# Patient Record
Sex: Male | Born: 1940 | Race: White | Hispanic: No | Marital: Married | Smoking: Former smoker
Health system: Southern US, Community
[De-identification: ages and names within clinical notes are randomized; demographics above are authoritative.]

## PROBLEM LIST (undated history)

## (undated) DIAGNOSIS — I2510 Atherosclerotic heart disease of native coronary artery without angina pectoris: Secondary | ICD-10-CM

## (undated) DIAGNOSIS — N200 Calculus of kidney: Secondary | ICD-10-CM

## (undated) DIAGNOSIS — F419 Anxiety disorder, unspecified: Secondary | ICD-10-CM

## (undated) DIAGNOSIS — E119 Type 2 diabetes mellitus without complications: Secondary | ICD-10-CM

## (undated) DIAGNOSIS — K219 Gastro-esophageal reflux disease without esophagitis: Secondary | ICD-10-CM

## (undated) DIAGNOSIS — R32 Unspecified urinary incontinence: Secondary | ICD-10-CM

## (undated) DIAGNOSIS — I10 Essential (primary) hypertension: Secondary | ICD-10-CM

## (undated) DIAGNOSIS — J310 Chronic rhinitis: Secondary | ICD-10-CM

## (undated) DIAGNOSIS — E785 Hyperlipidemia, unspecified: Secondary | ICD-10-CM

## (undated) DIAGNOSIS — C61 Malignant neoplasm of prostate: Secondary | ICD-10-CM

## (undated) DIAGNOSIS — J45909 Unspecified asthma, uncomplicated: Secondary | ICD-10-CM

## (undated) HISTORY — DX: Unspecified asthma, uncomplicated: J45.909

## (undated) HISTORY — DX: Hyperlipidemia, unspecified: E78.5

## (undated) HISTORY — DX: Malignant neoplasm of prostate: C61

## (undated) HISTORY — DX: Essential (primary) hypertension: I10

## (undated) HISTORY — DX: Calculus of kidney: N20.0

## (undated) HISTORY — DX: Unspecified urinary incontinence: R32

## (undated) HISTORY — PX: CARDIAC SURGERY: SHX584

## (undated) HISTORY — DX: Anxiety disorder, unspecified: F41.9

## (undated) HISTORY — DX: Gastro-esophageal reflux disease without esophagitis: K21.9

## (undated) HISTORY — DX: Type 2 diabetes mellitus without complications: E11.9

## (undated) HISTORY — DX: Chronic rhinitis: J31.0

## (undated) HISTORY — DX: Atherosclerotic heart disease of native coronary artery without angina pectoris: I25.10

## (undated) HISTORY — PX: CHOLECYSTECTOMY: SHX55

---

## 2015-11-22 ENCOUNTER — Encounter: Payer: Self-pay | Admitting: Internal Medicine

## 2015-11-22 ENCOUNTER — Ambulatory Visit (INDEPENDENT_AMBULATORY_CARE_PROVIDER_SITE_OTHER): Payer: Medicare Other | Admitting: Internal Medicine

## 2015-11-22 VITALS — BP 138/78 | HR 67 | Ht 73.0 in | Wt 196.6 lb

## 2015-11-22 DIAGNOSIS — J45991 Cough variant asthma: Secondary | ICD-10-CM | POA: Diagnosis not present

## 2015-11-22 DIAGNOSIS — R058 Other specified cough: Secondary | ICD-10-CM

## 2015-11-22 DIAGNOSIS — R05 Cough: Secondary | ICD-10-CM | POA: Diagnosis not present

## 2015-11-22 LAB — NITRIC OXIDE: Nitric Oxide: 24

## 2015-11-22 MED ORDER — BUDESONIDE-FORMOTEROL FUMARATE 80-4.5 MCG/ACT IN AERO: 2.0000 | INHALATION_SPRAY | Freq: Two times a day (BID) | RESPIRATORY_TRACT | 0 refills | Status: DC

## 2015-11-22 MED ORDER — BUDESONIDE-FORMOTEROL FUMARATE 80-4.5 MCG/ACT IN AERO: INHALATION_SPRAY | RESPIRATORY_TRACT | 12 refills | Status: AC

## 2015-11-22 NOTE — Patient Instructions (Addendum)
Stop advair and start symbicort 80 Take 2 puffs first thing in am and then another 2 puffs about 12 hours later - if better continue symbicort,  If not ok to resume advair if prefer it or insurance won't pay for symbicort   Work on inhaler technique:  relax and gently blow all the way out then take a nice smooth deep breath back in, triggering the inhaler at same time you start breathing in.  Hold for up to 5 seconds if you can. Blow out thru nose. Rinse and gargle with water when done     Change nexium to 40 mg Take 30-60 min before first meal of the day and pepcid ac 20mg  at bedtime   GERD (REFLUX)  is an extremely common cause of respiratory symptoms just like yours , many times with no obvious heartburn at all.    It can be treated with medication, but also with lifestyle changes including elevation of the head of your bed (ideally with 6 inch  bed blocks),  Smoking cessation, avoidance of late meals, excessive alcohol, and avoid fatty foods, chocolate, peppermint, colas, red wine, and acidic juices such as orange juice.  NO MINT OR MENTHOL PRODUCTS SO NO COUGH DROPS   USE SUGARLESS CANDY INSTEAD (Jolley ranchers or Stover's or Life Savers) or even ice chips will also do - the key is to swallow to prevent all throat clearing. NO OIL BASED VITAMINS - use powdered substitutes.  Please schedule a follow up office visit in 4 weeks, sooner if needed

## 2015-11-22 NOTE — Progress Notes (Signed)
Subjective:    Patient ID: Jeremy Bates, male    DOB: 1941/01/04,    MRN: [id number]  HPI  89 yowm quit smoking in 20's with freq flares of bronchitis then worse in 30's and  requiring daily advair since around  2007 referred to pulmonary clinic 11/22/2015 by Dr Valora Piccolo re poorly controlled rhinitis x lifelong and worse in spring and fall with green mucus with either treatment with abx plus /minus and twice prednisone eliminated it completely   11/22/2015 1st [facility] office visit/ Pansie Guggisberg  On advair/ atrovent NS/astelin/singulair/ nexium daily Chief Complaint  Patient presents with  . pulmonary consult    per Dr. Valora Piccolo for hx of copd & asthma. dx around 2007. had an asthma attack 2-29mo ago improved with prednisone.   each am wakes up nasal drainage and mucus clear x half hour to resolved daily year round - in terms of sob attacks assoc with chest tightness and prev   attributed to asthma he Rarely uses alb but during the flares up to  3 x per day  - Prednisone always eliminates the breathing problems but not the rhinitis symptoms that he thinks are linked  to the cough /wheeze as they rhinitis typically flares first.  Allergy eval neg 5 y prior to OV "neg" per pt     No obvious day to day or daytime variability or assoc  purulent sputum or mucus plugs or hemoptysis or cp or chest tightness, subjective wheeze or overt hb symptoms. No unusual exp hx or h/o childhood pna/ asthma or knowledge of premature birth.  Sleeping ok without nocturnal  or early am exacerbation  of respiratory  c/o's or need for noct saba. Also denies any obvious fluctuation of symptoms with weather or environmental changes or other aggravating or alleviating factors except as outlined above   Current Medications, Allergies, Complete Past Medical History, Past Surgical History, Family History, and Social History were reviewed in [employer] record.  ROS  The following are not active complaints  unless bolded sore throat, dysphagia, dental problems, itching, sneezing,  nasal congestion or excess drainage/ purulent secretions, ear ache,   fever, chills, sweats, unintended wt loss, classically pleuritic or exertional cp,  orthopnea pnd or leg swelling, presyncope, palpitations, abdominal pain, anorexia, nausea, vomiting, diarrhea  or change in bowel or bladder habits, change in stools or urine, dysuria,hematuria,  rash, arthralgias, visual complaints, headache, numbness, weakness or ataxia or problems with walking or coordination,  change in mood/affect or memory.           Review of Systems  Eyes: Positive for redness.  Hematological: Bruises/bleeds easily.       Objective:   Physical Exam   amb wm nad  Wt Readings from Last 3 Encounters:  11/22/15 196 lb 9.6 oz (89.2 kg)    Vital signs reviewed    HEENT: nl dentition, turbinates, and oropharynx. Nl external ear canals without cough reflex   NECK :  without JVD/Nodes/TM/ nl carotid upstrokes bilaterally   LUNGS: no acc muscle use,  Nl contour chest which is clear to A and P bilaterally without cough on insp or exp maneuvers   CV:  RRR  no s3 or murmur or increase in P2, no edema   ABD:  soft and nontender with nl inspiratory excursion in the supine position. No bruits or organomegaly, bowel sounds nl  MS:  Nl gait/ ext warm without deformities, calf tenderness, cyanosis or clubbing No obvious joint restrictions  SKIN: warm and dry without lesions    NEURO:  alert, approp, nl sensorium with  no motor deficits      I personally reviewed images and agree with radiology impression as follows:  CXR:  10/21/15  There is no acute cardiopulmonary abnormality. Stable top-normal cardiac size without pulmonary edema. Post CABG changes.      Assessment & Plan:

## 2015-11-23 NOTE — Assessment & Plan Note (Addendum)
Classic Upper airway cough syndrome, so named because it's frequently impossible to sort out how much is  CR/sinusitis with freq throat clearing (which can be related to primary GERD)   vs  causing  secondary (" extra esophageal")  GERD from wide swings in gastric pressure that occur with throat clearing, often  promoting self use of mint and menthol lozenges that reduce the lower esophageal sphincter tone and exacerbate the problem further in a cyclical fashion.   These are the same pts (now being labeled as having "irritable larynx syndrome" by some cough centers) who not infrequently have a history of having failed to tolerate ace inhibitors,  dry powder inhalers or biphosphonates or report having atypical reflux symptoms that don't respond to standard doses of PPI , and are easily confused as having aecopd or asthma flares by even experienced allergists/ pulmonologists myself included.  For now no change in rx but should be able to consolidate/ symplify rx as a sign of ineffective therapy = taking too medications for the same problem and he is on every category I know of to treat rhinitis.

## 2015-11-23 NOTE — Assessment & Plan Note (Addendum)
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 DDX of  difficult airways management almost all start with A and  include Adherence, Ace Inhibitors, Acid Reflux, Active Sinus Disease, Alpha 1 Antitripsin deficiency, Anxiety masquerading as Airways dz,  ABPA,  Allergy(esp in young), Aspiration (esp in elderly), Adverse effects of meds,  Active smokers, A bunch of PE's (a small clot burden can't cause this syndrome unless there is already severe underlying pulm or vascular dz with poor reserve) plus two Bs  = Bronchiectasis and Beta blocker use..and one C= CHF  Adherence is always the initial "prime suspect" and is a multilayered concern that requires a "trust but verify" approach in every patient - starting with knowing how to use medications, especially inhalers, correctly, keeping up with refills and understanding the fundamental difference between maintenance and prns vs those medications only taken for a very short course and then stopped and not refilled.  - The proper method of use, as well as anticipated side effects, of a metered-dose inhaler are discussed and demonstrated to the patient. Improved effectiveness after extensive coaching during this visit to a level of approximately 75 % from a baseline of 50 % > try symbicort 80 2bid  ? Acid (or non-acid) GERD > always difficult to exclude as up to 75% of pts in some series report no assoc GI/ Heartburn symptoms> rec max (24h)  acid suppression and diet restrictions/ reviewed and instructions given in writing.   ? Adverse effects of advair, which can aggravate uacs (see a/p)  ? Allergies/ low NO rules against and he states w/u neg prev but will continue singulair for now  Active  sinus dz /rhinitis >  see uacs   For now will focus on these issues and return in 6 weeks to regroup re management of rhinitis   Total time devoted to counseling  = 35/21m review case with pt/ discussion of options/alternatives/ personally creating written instructions  in presence of pt  then going over those specific  Instructions directly with the pt including how to use all of the meds but in particular covering each new medication in detail and the difference between the maintenance/automatic meds and the prns using an action plan format for the latter.

## 2015-12-20 ENCOUNTER — Other Ambulatory Visit (INDEPENDENT_AMBULATORY_CARE_PROVIDER_SITE_OTHER): Payer: Medicare Other

## 2015-12-20 ENCOUNTER — Ambulatory Visit (INDEPENDENT_AMBULATORY_CARE_PROVIDER_SITE_OTHER): Payer: Medicare Other | Admitting: Internal Medicine

## 2015-12-20 ENCOUNTER — Ambulatory Visit (INDEPENDENT_AMBULATORY_CARE_PROVIDER_SITE_OTHER)
Admission: RE | Admit: 2015-12-20 | Discharge: 2015-12-20 | Disposition: A | Discharge status: Home / Self Care | Payer: Medicare Other | Source: Ambulatory Visit | Attending: Internal Medicine | Admitting: Internal Medicine

## 2015-12-20 ENCOUNTER — Encounter: Payer: Self-pay | Admitting: Internal Medicine

## 2015-12-20 VITALS — BP 134/70 | HR 53 | Ht 72.0 in | Wt 197.0 lb

## 2015-12-20 DIAGNOSIS — R058 Other specified cough: Secondary | ICD-10-CM

## 2015-12-20 DIAGNOSIS — J45991 Cough variant asthma: Secondary | ICD-10-CM | POA: Diagnosis not present

## 2015-12-20 DIAGNOSIS — R05 Cough: Secondary | ICD-10-CM

## 2015-12-20 LAB — CBC WITH DIFFERENTIAL/PLATELET
BASOS ABS: 0.1 10*3/uL (ref 0.0–0.1)
Basophils Relative: 1.2 % (ref 0.0–3.0)
EOS ABS: 0.3 10*3/uL (ref 0.0–0.7)
EOS PCT: 5.1 % — AB (ref 0.0–5.0)
HCT: 38.7 % — ABNORMAL LOW (ref 39.0–52.0)
HEMOGLOBIN: 13.3 g/dL (ref 13.0–17.0)
LYMPHS ABS: 1.1 10*3/uL (ref 0.7–4.0)
Lymphocytes Relative: 18.9 % (ref 12.0–46.0)
MCHC: 34.4 g/dL (ref 30.0–36.0)
MCV: 85.9 fl (ref 78.0–100.0)
MONO ABS: 0.5 10*3/uL (ref 0.1–1.0)
Monocytes Relative: 8.8 % (ref 3.0–12.0)
NEUTROS PCT: 66 % (ref 43.0–77.0)
Neutro Abs: 3.8 10*3/uL (ref 1.4–7.7)
Platelets: 204 10*3/uL (ref 150.0–400.0)
RBC: 4.5 Mil/uL (ref 4.22–5.81)
RDW: 14.7 % (ref 11.5–15.5)
WBC: 5.8 10*3/uL (ref 4.0–10.5)

## 2015-12-20 NOTE — Progress Notes (Signed)
Subjective:    Patient ID: Jeremy Bates, male    DOB: 1940/08/16,    MRN: [id number]    Brief patient profile:  68 yowm quit smoking in 20's with freq flares of bronchitis then worse in 30's and  requiring daily advair since around  2007 referred to pulmonary clinic 11/22/2015 by Dr Valora Piccolo re poorly controlled rhinitis x lifelong and worse in spring and fall with green mucus with either treatment with abx plus /minus and twice prednisone eliminated it completely   History of Present Illness  11/22/2015 1st [facility] office visit/ Jeremy Bates  On advair/ atrovent NS/astelin/singulair/ nexium daily Chief Complaint  Patient presents with  . pulmonary consult    per Dr. Valora Piccolo for hx of copd & asthma. dx around 2007. had an asthma attack 2-71mo ago improved with prednisone.   each am wakes up nasal drainage and mucus clear x half hour to resolved daily year round - in terms of sob attacks assoc with chest tightness and prev   attributed to asthma he Rarely uses alb but during the flares up to  3 x per day  - Prednisone always eliminates the breathing problems but not the rhinitis symptoms that he thinks are linked  to the cough /wheeze as they rhinitis typically flares first. Allergy eval neg 5 y prior to OV "neg" per pt   rec Stop advair and start symbicort 80 Take 2 puffs first thing in am and then another 2 puffs about 12 hours later - if better continue symbicort,  If not ok to resume advair if prefer it or insurance won't pay for symbicort  Work on inhaler technique:   Change nexium to 40 mg Take 30-60 min before first meal of the day and pepcid ac 20mg  at bedtime  GERD (REFLUX) Please schedule a follow up office visit in 4 weeks, sooner if needed     12/20/2015  f/u ov/Jeremy Bates re: cough variant asthma vs uacs on symbicort 80/singulair Nexium/ pepcid  Chief Complaint  Patient presents with  . Follow-up    Pt c/o sinus congestion and acid reflux symptoms.   breathing is better to his  satisfaction and no need for albtuerol Morning's are tough p waking up takes about hour to clear out "drainage in back of throat" > fill up a small cup x one hours> clear mucus x years flares in spring and fall despite neg allergy w/u previously.   No obvious other day to day or daytime variability or assoc  purulent sputum or mucus plugs or hemoptysis or cp or chest tightness, subjective wheeze or overt hb symptoms. No unusual exp hx or h/o childhood pna/ asthma or knowledge of premature birth.  Sleeping ok without nocturnal  or early am exacerbation  of respiratory  c/o's or need for noct saba. Also denies any obvious fluctuation of symptoms with weather or environmental changes or other aggravating or alleviating factors except as outlined above   Current Medications, Allergies, Complete Past Medical History, Past Surgical History, Family History, and Social History were reviewed in [employer] record.  ROS  The following are not active complaints unless bolded sore throat, dysphagia, dental problems, itching, sneezing,  nasal congestion or excess drainage/ purulent secretions, ear ache,   fever, chills, sweats, unintended wt loss, classically pleuritic or exertional cp,  orthopnea pnd or leg swelling, presyncope, palpitations, abdominal pain, anorexia, nausea, vomiting, diarrhea  or change in bowel or bladder habits, change in stools or urine, dysuria,hematuria,  rash, arthralgias, visual complaints, headache, numbness, weakness or ataxia or problems with walking or coordination,  change in mood/affect or memory.                 Objective:   Physical Exam   amb wm nad  Wt Readings from Last 3 Encounters:  11/22/15 196 lb 9.6 oz (89.2 kg)    Vital signs reviewed    HEENT: nl dentition, turbinates, and oropharynx. Nl external ear canals without cough reflex   NECK :  without JVD/Nodes/TM/ nl carotid upstrokes bilaterally   LUNGS: no acc muscle use,  Nl  contour chest which is clear to A and P bilaterally without cough on insp or exp maneuvers   CV:  RRR  no s3 or murmur or increase in P2, no edema   ABD:  soft and nontender with nl inspiratory excursion in the supine position. No bruits or organomegaly, bowel sounds nl  MS:  Nl gait/ ext warm without deformities, calf tenderness, cyanosis or clubbing No obvious joint restrictions   SKIN: warm and dry without lesions    NEURO:  alert, approp, nl sensorium with  no motor deficits      I personally reviewed images and agree with radiology impression as follows:  CXR:  10/21/15  There is no acute cardiopulmonary abnormality. Stable top-normal cardiac size without pulmonary edema. Post CABG changes.   Labs ordered 12/20/2015 cbc with diff/ eos     Assessment & Plan:

## 2015-12-20 NOTE — Patient Instructions (Signed)
Stop singulair and add For drainage / throat tickle try take CHLORPHENIRAMINE  4 mg - take one every 4 hours as needed - available over the counter- may cause drowsiness so start with just a bedtime dose or two and see how you tolerate it before trying in daytime     Please see patient coordinator before you leave today  to schedule sinus ct  Please remember to go to the lab   department downstairs for your tests - we will call you with the results when they are available.     See Tammy NP 4 weeks with all your medications, even over the counter meds, separated in two separate bags, the ones you take no matter what vs the ones you stop once you feel better and take only as needed when you feel you need them.   Tammy  will generate for you a new user friendly medication calendar that will put [place] all on the same page re: your medication use.     Without this process, it simply isn't possible to assure that we are providing  your outpatient care  with  the attention to detail we feel you deserve.   If we cannot assure that you're getting that kind of care,  then we cannot manage your problem effectively from this clinic.  Once you have seen Tammy and we are sure that we're all on the same page with your medication use she will arrange follow up with me.

## 2015-12-22 NOTE — Assessment & Plan Note (Signed)
Trial of max gerd rx/ diet 11/22/2015 > breathing better but not drainage -  Allergy profile 12/20/2015 >  Eos 0. /  IgE   - sinus CT 12/20/2015 >>>   Pattern is longstanding and probably non -specific rhinitis and doesn't appear to be responding to singulair so rec trial off of this an on 1st gen h1 if tolerates

## 2015-12-22 NOTE — Assessment & Plan Note (Signed)
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  Contingencies discussed in full including contacting this office immediately if not controlling the symptoms using the rule of two's.     I had an extended discussion with the patient reviewing all relevant studies completed to date and  lasting 15 to 20 minutes of a 25 minute visit    Each maintenance medication was reviewed in detail including most importantly the difference between maintenance and prns and under what circumstances the prns are to be triggered using an action plan format that is not reflected in the computer generated alphabetically organized AVS.    Please see instructions for details which were reviewed in writing and the patient given a copy highlighting the part that I personally wrote and discussed at today's ov.

## 2015-12-23 NOTE — Progress Notes (Signed)
LMTCB

## 2015-12-24 ENCOUNTER — Telehealth: Payer: Self-pay | Admitting: Internal Medicine

## 2015-12-24 LAB — RESPIRATORY ALLERGY PROFILE REGION II ~~LOC~~
Allergen, Cedar tree, t12: <0.1 kU/L
Allergen, Mouse Urine Protein, e78: <0.1 kU/L
Allergen, Oak,t7: <0.1 kU/L
Aspergillus fumigatus, m3: <0.1 kU/L
Cat Dander: <0.1 kU/L
Dog Dander: <0.1 kU/L
Elm IgE: <0.1 kU/L
IGE (IMMUNOGLOBULIN E), SERUM: 35 kU/L (ref <115)
Pecan/Hickory Tree IgE: <0.1 kU/L
Rough Pigweed  IgE: <0.1 kU/L
Sheep Sorrel IgE: <0.1 kU/L
Timothy Grass: <0.1 kU/L

## 2015-12-24 NOTE — Telephone Encounter (Signed)
Tanda Rockers, MD  Rosana Berger, CMA        Call pt: Reviewed cxr and no acute change so no change in recommendations made at [facility]   ---  Spoke with spouse (on [place]). She is aware of results and had no questions. Nothing further needed

## 2015-12-26 ENCOUNTER — Ambulatory Visit (INDEPENDENT_AMBULATORY_CARE_PROVIDER_SITE_OTHER): Payer: Medicare Other

## 2015-12-26 DIAGNOSIS — I6523 Occlusion and stenosis of bilateral carotid arteries: Secondary | ICD-10-CM | POA: Diagnosis not present

## 2015-12-26 DIAGNOSIS — R058 Other specified cough: Secondary | ICD-10-CM

## 2015-12-26 DIAGNOSIS — R05 Cough: Secondary | ICD-10-CM

## 2015-12-27 ENCOUNTER — Telehealth: Payer: Self-pay | Admitting: Internal Medicine

## 2015-12-27 DIAGNOSIS — J329 Chronic sinusitis, unspecified: Secondary | ICD-10-CM

## 2015-12-27 MED ORDER — AMOXICILLIN-POT CLAVULANATE 875-125 MG PO TABS: 1.0000 | ORAL_TABLET | Freq: Two times a day (BID) | ORAL | 0 refills | Status: DC

## 2015-12-27 MED ORDER — PREDNISONE 10 MG PO TABS: ORAL_TABLET | ORAL | 0 refills | Status: DC

## 2015-12-27 NOTE — Telephone Encounter (Signed)
Called and spoke with pt and he stated that he had chest congestion and nasal congestion.  He has had this for about 4 days.  Cough with clear sputum.  He is requesting that prednisone be sent in for him. MW please advise. Thanks  No Known Allergies   Patient Instructions    Stop singulair and add For drainage / throat tickle try take CHLORPHENIRAMINE  4 mg - take one every 4 hours as needed - available over the counter- may cause drowsiness so start with just a bedtime dose or two and see how you tolerate it before trying in daytime     Please see patient coordinator before you leave today  to schedule sinus ct  Please remember to go to the lab   department downstairs for your tests - we will call you with the results when they are available.     See Tammy NP 4 weeks with all your medications, even over the counter meds, separated in two separate bags, the ones you take no matter what vs the ones you stop once you feel better and take only as needed when you feel you need them.   Tammy  will generate for you a new user friendly medication calendar that will put [place] all on the same page re: your medication use.     Without this process, it simply isn't possible to assure that we are providing  your outpatient care  with  the attention to detail we feel you deserve.   If we cannot assure that you're getting that kind of care,  then we cannot manage your problem effectively from this clinic.  Once you have seen Tammy and we are sure that we're all on the same page with your medication use she will arrange follow up with me.        After Visit Summary (Printed 12/20/2015)  Communications

## 2015-12-27 NOTE — Telephone Encounter (Signed)
Pt is aware of recs from MW as well as the CT results below:  Result Notes   Notes Recorded by Tanda Rockers, MD on 12/27/2015 at 5:22 AM EDT Call patient : Study is c/w sinusitis > rec augmentin x 20 days then repeat ct/ ov at 21 days d/c chlorpheniramine and just use clariton or allegra prn     Future CT sinus order placed for 21st (after abx) and will schedule OV there after. (note placed in CT order to inform nurse of date of CT to make OV follow as well).  [place]

## 2015-12-27 NOTE — Telephone Encounter (Signed)
Prednisone 10 mg take  4 each am x 2 days,   2 each am x 2 days,  1 each am x 2 days and stop    Also see result note on sinus CT

## 2016-01-07 ENCOUNTER — Telehealth: Payer: Self-pay | Admitting: Internal Medicine

## 2016-01-07 NOTE — Telephone Encounter (Signed)
Spoke with pt. States that his feeling better since seeing MW but is still have some issues. Reports cough and wheezing. Cough is producing thick clear mucus. Denies SOB, chest tightness or fever. Has 7 days left of Augmentin but is finished with prednisone. Pt is leaving to go out of town and wants to know if there is anything else he needs to do for his current symptoms.  MW - please advise. Thanks.

## 2016-01-07 NOTE — Telephone Encounter (Signed)
Pt aware of rec's per MW.  Nothing further needed.  

## 2016-01-07 NOTE — Telephone Encounter (Signed)
Finish all the abx and if not better then repeat sinus ct and let us refer to ent prn if the sinuses fail to clear

## 2016-01-21 ENCOUNTER — Ambulatory Visit (INDEPENDENT_AMBULATORY_CARE_PROVIDER_SITE_OTHER): Payer: Medicare Other

## 2016-01-21 DIAGNOSIS — J341 Cyst and mucocele of nose and nasal sinus: Secondary | ICD-10-CM | POA: Diagnosis not present

## 2016-01-21 DIAGNOSIS — J329 Chronic sinusitis, unspecified: Secondary | ICD-10-CM

## 2016-01-22 ENCOUNTER — Telehealth: Payer: Self-pay | Admitting: Internal Medicine

## 2016-01-22 NOTE — Telephone Encounter (Signed)
Tanda Rockers, MD  Rosana Berger, [place]        Call patient : Jeremy Bates is still showing sinusitis though better p abx, if still having any symptoms > ent refer now shoemaker, if not can wait until ov then regroup then but be sure has f/u   -----  I spoke with patient about results and he verbalized understanding and had no questions. Pt reports he will wait until he see's TP as he has improved.

## 2016-01-22 NOTE — Progress Notes (Signed)
lmtcb

## 2016-01-28 ENCOUNTER — Ambulatory Visit (INDEPENDENT_AMBULATORY_CARE_PROVIDER_SITE_OTHER): Payer: Medicare Other | Admitting: Adult Health

## 2016-01-28 ENCOUNTER — Encounter: Payer: Self-pay | Admitting: Adult Health

## 2016-01-28 DIAGNOSIS — R05 Cough: Secondary | ICD-10-CM

## 2016-01-28 DIAGNOSIS — R058 Other specified cough: Secondary | ICD-10-CM

## 2016-01-28 DIAGNOSIS — Z23 Encounter for immunization: Secondary | ICD-10-CM

## 2016-01-28 DIAGNOSIS — J45991 Cough variant asthma: Secondary | ICD-10-CM

## 2016-01-28 NOTE — Patient Instructions (Addendum)
Add Delsym 2 tsp Twice daily  As needed  Cough  Add Chlortrimeton 4mg  At bedtime  For drainage.  May use Chlortrimeton 4mg  every 4hr for drainage As needed  , may make you sleepy.  Try to use sips of water and sugarless candy to avoid coughing and throat clearing  Goal is not to cough .  DO NOT USE MINTS/MINT PRODUCTS. New things to buy over the counter are chlortrimeton and delsym .  Follow med calendar closely and bring to each visit.  Follow up Dr. Melvyn Novas  In 2-3 months and As needed   Flu shot today

## 2016-01-28 NOTE — Assessment & Plan Note (Signed)
Improving with tx of sinsusitis   Plan  Patient Instructions  Add Delsym 2 tsp Twice daily  As needed  Cough  Add Chlortrimeton 4mg  At bedtime  For drainage.  May use Chlortrimeton 4mg  every 4hr for drainage As needed  , may make you sleepy.  Try to use sips of water and sugarless candy to avoid coughing and throat clearing  Goal is not to cough .  DO NOT USE MINTS/MINT PRODUCTS. New things to buy over the counter are chlortrimeton and delsym .  Follow med calendar closely and bring to each visit.  Follow up Dr. Melvyn Novas  In 2-3 months and As needed   Flu shot today

## 2016-01-28 NOTE — Assessment & Plan Note (Signed)
Improved control with trigger management (GERD/AR/cough )  Patient's medications were reviewed today and patient education was given. Computerized medication calendar was adjusted/completed   Plan  Patient Instructions  Add Delsym 2 tsp Twice daily  As needed  Cough  Add Chlortrimeton 4mg  At bedtime  For drainage.  May use Chlortrimeton 4mg  every 4hr for drainage As needed  , may make you sleepy.  Try to use sips of water and sugarless candy to avoid coughing and throat clearing  Goal is not to cough .  DO NOT USE MINTS/MINT PRODUCTS. New things to buy over the counter are chlortrimeton and delsym .  Follow med calendar closely and bring to each visit.  Follow up Dr. Melvyn Novas  In 2-3 months and As needed   Flu shot today

## 2016-01-28 NOTE — Progress Notes (Signed)
Subjective:    Patient ID: Jeremy Bates, male    DOB: 01/22/1941,    MRN: [id number]    Brief patient profile:  73 yowm quit smoking in 20's with freq flares of bronchitis then worse in 30's and  requiring daily advair since around  2007 referred to pulmonary clinic 11/22/2015 by Dr Jeremy Bates re poorly controlled rhinitis x lifelong and worse in spring and fall with green mucus with either treatment with abx plus /minus and twice prednisone eliminated it completely   History of Present Illness  11/22/2015 1st [name] office visit/ Wert  On advair/ atrovent NS/astelin/singulair/ nexium daily Chief Complaint  Patient presents with  . pulmonary consult    per Dr. Valora Bates for hx of copd & asthma. dx around 2007. had an asthma attack 2-51mo ago improved with prednisone.   each am wakes up nasal drainage and mucus clear x half hour to resolved daily year round - in terms of sob attacks assoc with chest tightness and prev   attributed to asthma he Rarely uses alb but during the flares up to  3 x per day  - Prednisone always eliminates the breathing problems but not the rhinitis symptoms that he thinks are linked  to the cough /wheeze as they rhinitis typically flares first. Allergy eval neg 5 y prior to OV "neg" per pt   rec Stop advair and start symbicort 80 Take 2 puffs first thing in am and then another 2 puffs about 12 hours later - if better continue symbicort,  If not ok to resume advair if prefer it or insurance won't pay for symbicort  Work on inhaler technique:   Change nexium to 40 mg Take 30-60 min before first meal of the day and pepcid ac 20mg  at bedtime  GERD (REFLUX) Please schedule a follow up office visit in 4 weeks, sooner if needed     12/20/2015  f/u ov/Wert re: cough variant asthma vs uacs on symbicort 80/singulair Nexium/ pepcid  Chief Complaint  Patient presents with  . Follow-up    Pt c/o sinus congestion and acid reflux symptoms.   breathing is better to his  satisfaction and no need for albtuerol Morning's are tough p waking up takes about hour to clear out "drainage in back of throat" > fill up a small cup x one hours> clear mucus x years flares in spring and fall despite neg allergy w/u previously.  >>CT sinus + sinusitis , Augmentin x 20 d   01/28/2016 Follow up : Cough variant asthma/sinusitis  Patient returns for a 6 week follow-up and medication review. Patient's been evaluated for persistent cough, congestion . Is being treated for suspected cough very asthma. A CT sinus showed positive sinusitis with air-fluid levels in the maxillary sinuses and minimally in the sphenoid sinus.Marland Kitchen He was started on 20 days of Augmentin. Follow-up CT on 01/21/2016 showed small air-fluid level in the bilateral maxillary sinus. Clear sphenoid sinus. Since  finishing antibiotics  patient is feeling better but still has daily chest congestion.  Worse in morning . Seems better after taking Symbicort. Has daily sinus drainage that seems to improve as day goes on. Says he has been seen in past by ENT in [facility] . No previous sinus surgery.  Was recommended sinus rinses  And rhinitis treatment.  We discussed referral to ENT , he feels this is chronic all his life. He feels he is better and is making progress. Cough is improving. He denies any chest  pain, orthopnea, PND, or increased leg swelling    Reviewed all his medications organize them into a medication calendar with patient education Appears she is taking his medications correctly.    Medications, Allergies, Complete Past Medical History, Past Surgical History, Family History, and Social History were reviewed in [employer] record.  ROS  The following are not active complaints unless bolded sore throat, dysphagia, dental problems, itching, sneezing,  nasal congestion or excess drainage/ purulent secretions, ear ache,   fever, chills, sweats, unintended wt loss, classically pleuritic or  exertional cp,  orthopnea pnd or leg swelling, presyncope, palpitations, abdominal pain, anorexia, nausea, vomiting, diarrhea  or change in bowel or bladder habits, change in stools or urine, dysuria,hematuria,  rash, arthralgias, visual complaints, headache, numbness, weakness or ataxia or problems with walking or coordination,  change in mood/affect or memory.                 Objective:   Physical Exam   amb wm nad Vitals:   01/28/16 1022  BP: 124/62  Pulse: (!) 55  Temp: 97.7 F (36.5 C)  TempSrc: Oral  SpO2: 98%  Weight: 197 lb (89.4 kg)  Height: 6' (1.829 m)      Vital signs reviewed    HEENT: nl dentition, turbinates, and oropharynx. Nl external ear canals without cough reflex   NECK :  without JVD/Nodes/TM/ nl carotid upstrokes bilaterally   LUNGS: no acc muscle use,  Nl contour chest which is clear to A and P bilaterally without cough on insp or exp maneuvers   CV:  RRR  no s3 or murmur or increase in P2, no edema   ABD:  soft and nontender with nl inspiratory excursion in the supine position. No bruits or organomegaly, bowel sounds nl  MS:  Nl gait/ ext warm without deformities, calf tenderness, cyanosis or clubbing No obvious joint restrictions   SKIN: warm and dry without lesions    NEURO:  alert, approp, nl sensorium with  no motor deficits      CXR:  10/21/15  There is no acute cardiopulmonary abnormality. Stable top-normal cardiac size without pulmonary edema. Post CABG changes.   Labs ordered 12/20/2015 cbc with diff/ eos Eos 5.1 , RAST neg , IgE 35.   CT sinus 12/26/15>Small air-fluid levels in the maxillary sinuses bilaterally.Probable mucosal retention cyst at the floors of the maxillarysinuses bilaterally larger on LEFT.Minimal fluid in sphenoid sinus.  CT sinus 01/21/16 >Small air-fluid level noted bilateral maxillary sinus. Bilateralfloor of maxillary sinus mucous retention cyst.2. Patent semilunar canal bilaterally.3. Unremarkable frontal  sinus and sphenoid sinus bilaterally.        Assessment & Plan:

## 2016-01-28 NOTE — Progress Notes (Signed)
Chart and office note reviewed in detail  > agree with a/p as outlined    

## 2016-01-29 NOTE — Addendum Note (Signed)
Addended by: Osa Craver on: 01/29/2016 11:03 AM   Modules accepted: Orders

## 2016-03-30 ENCOUNTER — Ambulatory Visit (INDEPENDENT_AMBULATORY_CARE_PROVIDER_SITE_OTHER): Payer: Medicare Other | Admitting: Internal Medicine

## 2016-03-30 ENCOUNTER — Encounter: Payer: Self-pay | Admitting: Internal Medicine

## 2016-03-30 VITALS — BP 120/70 | HR 60 | Ht 72.0 in | Wt 203.0 lb

## 2016-03-30 DIAGNOSIS — J45991 Cough variant asthma: Secondary | ICD-10-CM

## 2016-03-30 DIAGNOSIS — R05 Cough: Secondary | ICD-10-CM

## 2016-03-30 DIAGNOSIS — R058 Other specified cough: Secondary | ICD-10-CM

## 2016-03-30 NOTE — Patient Instructions (Signed)
Call us with the medication that is missing from your pm med calendar list   Work on inhaler technique:  relax and gently blow all the way out then take a nice smooth deep breath back in, triggering the inhaler at same time you start breathing in.  Hold for up to 5 seconds if you can. Blow out thru nose. Rinse and gargle with water when done  If not happy with nasal symptoms then next is let me refer you to ENT in [facility]   See calendar for specific medication instructions and bring it back for each and every office visit for every healthcare provider you see.  Without it,  you may not receive the best quality medical care that we feel you deserve.  You will note that the calendar groups together  your maintenance  medications that are timed at particular times of the day.  Think of this as your checklist for what your doctor has instructed you to do until your next evaluation to see what benefit  there is  to staying on a consistent group of medications intended to keep you well.  The other group at the bottom is entirely up to you to use as you see fit  for specific symptoms that may arise between visits that require you to treat them on an as needed basis.  Think of this as your action plan or "what if" list.   Separating the top medications from the bottom group is fundamental to providing you adequate care going forward.     If you are satisfied with your treatment plan,  let your doctor know and he/she can either refill your medications or you can return here when your prescription runs out.     If in any way you are not 100% satisfied,  please tell us.  If 100% better, tell your friends!  Pulmonary follow up is as needed

## 2016-03-30 NOTE — Progress Notes (Signed)
Subjective:    Patient ID: Jeremy Bates, male    DOB: 03/08/1941,    MRN: [id number]    Brief patient profile:  75 yowm quit smoking in 20's with freq flares of bronchitis then worse in 30's and  requiring daily advair since around  2007 referred to pulmonary clinic 11/22/2015 by Dr Valora Piccolo re poorly controlled rhinitis x lifelong and worse in spring and fall with green mucus with either treatment with abx plus /minus and twice prednisone eliminated it completely   History of Present Illness  11/22/2015 1st [facility] office visit/ Tayvon Culley  On advair/ atrovent NS/astelin/singulair/ nexium daily Chief Complaint  Patient presents with  . pulmonary consult    per Dr. Valora Piccolo for hx of copd & asthma. dx around 2007. had an asthma attack 2-87mo ago improved with prednisone.   each am wakes up nasal drainage and mucus clear x half hour to resolved daily year round - in terms of sob attacks assoc with chest tightness and prev   attributed to asthma he Rarely uses alb but during the flares up to  3 x per day  - Prednisone always eliminates the breathing problems but not the rhinitis symptoms that he thinks are linked  to the cough /wheeze as they rhinitis typically flares first. Allergy eval neg 5 y prior to OV "neg" per pt   rec Stop advair and start symbicort 80 Take 2 puffs first thing in am and then another 2 puffs about 12 hours later - if better continue symbicort,  If not ok to resume advair if prefer it or insurance won't pay for symbicort  Work on inhaler technique:   Change nexium to 40 mg Take 30-60 min before first meal of the day and pepcid ac 20mg  at bedtime  GERD diet      12/20/2015  f/u ov/[facility] re: cough variant asthma vs uacs on symbicort 80/singulair Nexium/ pepcid  Chief Complaint  Patient presents with  . Follow-up    Pt c/o sinus congestion and acid reflux symptoms.   breathing is better to his satisfaction and no need for albtuerol Morning's are tough p waking up takes  about hour to clear out "drainage in back of throat" > fill up a small cup x one hours> clear mucus x years flares in spring and fall despite neg allergy w/u previously.  >>CT sinus + sinusitis , Augmentin x 20 d    01/28/2016 Follow up : Cough variant asthma/sinusitis   . Follow-up CT on 01/21/2016 showed small air-fluid level in the bilateral maxillary sinus. Clear sphenoid sinus. Since  finishing antibiotics  patient is feeling better but still has daily chest congestion.  Worse in morning . Seems better after taking Symbicort. Has daily sinus drainage that seems to improve as day goes on. Says he has been seen in past by ENT in [facility] . No previous sinus surgery.  Was recommended sinus rinses  And rhinitis treatment.  We discussed referral to ENT , he feels this is chronic all his life. He feels he is better and is making progress. Cough is improving. Rec Add Delsym 2 tsp Twice daily  As needed  Cough  Add Chlortrimeton 4mg  At bedtime  For drainage.  May use Chlortrimeton 4mg  every 4hr for drainage As needed  , may make you sleepy.  Try to use sips of water and sugarless candy to avoid coughing and throat clearing  Goal is not to cough .  DO NOT USE MINTS/MINT  PRODUCTS. New things to buy over the counter are chlortrimeton and delsym .  Follow med calendar closely and bring to each visit.    03/30/2016  f/u ov/Kayelee Herbig re:  Cough varant asthma/ symb 80 2bid  Chief Complaint  Patient presents with  . Follow-up    Nasal congestion has only improved some. He has PND during the night and wakes up with coating on his tongue. He states that his chest seems to be clear and he is not coughing much.    R nostril still clogged up each am / clears up p astelin better than flonase so uses the latter prn   Not limited by breathing from desired activities    Has med calendar but not using correctly/ missing one prn he can't recall   No obvious day to day or daytime variability or assoc excess/  purulent sputum or mucus plugs or hemoptysis or cp or chest tightness, subjective wheeze or overt  hb symptoms. No unusual exp hx or h/o childhood pna/ asthma or knowledge of premature birth.  Sleeping ok without nocturnal  or early am exacerbation  of respiratory  c/o's or need for noct saba. Also denies any obvious fluctuation of symptoms with weather or environmental changes or other aggravating or alleviating factors except as outlined above   Current Medications, Allergies, Complete Past Medical History, Past Surgical History, Family History, and Social History were reviewed in [employer] record.  ROS  The following are not active complaints unless bolded sore throat, dysphagia, dental problems, itching, sneezing,  nasal congestion or excess/ purulent secretions, ear ache,   fever, chills, sweats, unintended wt loss, classically pleuritic or exertional cp,  orthopnea pnd or leg swelling, presyncope, palpitations, abdominal pain, anorexia, nausea, vomiting, diarrhea  or change in bowel or bladder habits, change in stools or urine, dysuria,hematuria,  rash, arthralgias, visual complaints, headache, numbness, weakness or ataxia or problems with walking or coordination,  change in mood/affect or memory.                    Objective:   Physical Exam     amb wm nad  Wt Readings from Last 3 Encounters:  03/30/16 203 lb (92.1 kg)  01/28/16 197 lb (89.4 kg)  12/20/15 197 lb (89.4 kg)    Vital signs reviewed     HEENT: nl dentition, turbinates, and oropharynx. Nl external ear canals without cough reflex   NECK :  without JVD/Nodes/TM/ nl carotid upstrokes bilaterally   LUNGS: no acc muscle use,  Nl contour chest which is clear to A and P bilaterally without cough on insp or exp maneuvers   CV:  RRR  no s3 or murmur or increase in P2, no edema   ABD:  soft and nontender with nl inspiratory excursion in the supine position. No bruits or organomegaly,  bowel sounds nl  MS:  Nl gait/ ext warm without deformities, calf tenderness, cyanosis or clubbing No obvious joint restrictions   SKIN: warm and dry without lesions    NEURO:  alert, approp, nl sensorium with  no motor deficits      CXR:  10/21/15  There is no acute cardiopulmonary abnormality. Stable top-normal cardiac size without pulmonary edema. Post CABG changes.   Labs ordered 12/20/2015 cbc with diff/ eos  Reviewed 03/30/2016  Eos 5.1 , RAST neg , IgE 35.        Assessment & Plan:

## 2016-03-31 NOTE — Assessment & Plan Note (Addendum)
FENO 11/22/2015  =   24 on advair  - Spirometry 11/22/2015  FEV1 2.25 (64%)  Ratio 69 but f/v not really physiologic - 11/22/2015  After extensive coaching HFA effectiveness =    > try symb 80 2bid   -med calendar 01/28/2016   I had an extended final summary discussion with the patient reviewing all relevant studies completed to date and  lasting 15 to 20 minutes of a 25 minute visit on the following issues:     All goals of chronic asthma control met including optimal function and elimination of symptoms with minimal need for rescue therapy.  Contingencies discussed in full including contacting this office immediately if not controlling the symptoms using the rule of two's.       Each maintenance medication was reviewed in detail including most importantly the difference between maintenance and as needed and under what circumstances the prns are to be used. This was done in the context of a medication calendar review which provided the patient with a user-friendly unambiguous mechanism for medication administration and reconciliation and provides an action plan for all active problems. It is critical that this be shown to every doctor  for modification during the office visit if necessary so the patient can use it as a working document.      See avs for details which reflects med list in alphabetized order (note this is distinctly different from the user friendly organized med calendar that has the identical list minus the one med he forgot to disclose but was asked to call back to [place]

## 2016-03-31 NOTE — Assessment & Plan Note (Signed)
Trial of max gerd rx/ diet 11/22/2015 > breathing better but not drainage -  Allergy profile 12/20/2015 >  Eos 0. 3/  IgE  35 neg RAST - sinus CT 12/26/15 >>> Air-fluid levels in the maxillary sinuses and minimally in sphenoid sinus with suspected small mucosal retention cysts at the maxillary Sinuses. rec augmentin x 20 days then repeat/ d/c chlorpheniramine - stop singulair and add chlorpheniramine 12/20/2015 >>> - Repeat Sinus CT 01/21/16 > 1. Small air-fluid level noted bilateral maxillary sinus. Bilateral floor of maxillary sinus mucous retention cyst > rec ENT prn      Already saw ent in [place] but not better, rec [facility] for 2nd opinion and in meantime start flonase one bid not prn

## 2016-04-09 ENCOUNTER — Telehealth: Payer: Self-pay | Admitting: Internal Medicine

## 2016-04-09 MED ORDER — AZITHROMYCIN 250 MG PO TABS: 250.0000 mg | ORAL_TABLET | ORAL | 0 refills | Status: AC

## 2016-04-09 NOTE — Telephone Encounter (Signed)
Spoke with pt. States that he is having issues with the starts of a chest cold. Reports coughing, sinus congestion and PND. When he coughs up mucus it's clear in color. Denies chest tightness, wheezing, SOB or fever. Symptoms started 3 days ago. Would like to have something called in.  MW - please advise. Thanks.

## 2016-04-09 NOTE — Telephone Encounter (Signed)
Spoke with the pt and notified of recs per MW  He verbalized understanding  Nothing further needed Rx was sent

## 2016-04-09 NOTE — Telephone Encounter (Signed)
[id number] 

## 2017-10-26 ENCOUNTER — Other Ambulatory Visit: Payer: Self-pay | Admitting: Internal Medicine

## 2018-06-17 IMAGING — DX DG CHEST 2V
2 series · 2 of 2 positions shown · non-contrast
Comparison: 10/21/2015 .

CLINICAL DATA: CABG.

EXAM:
CHEST  2 VIEW

[chest pa]
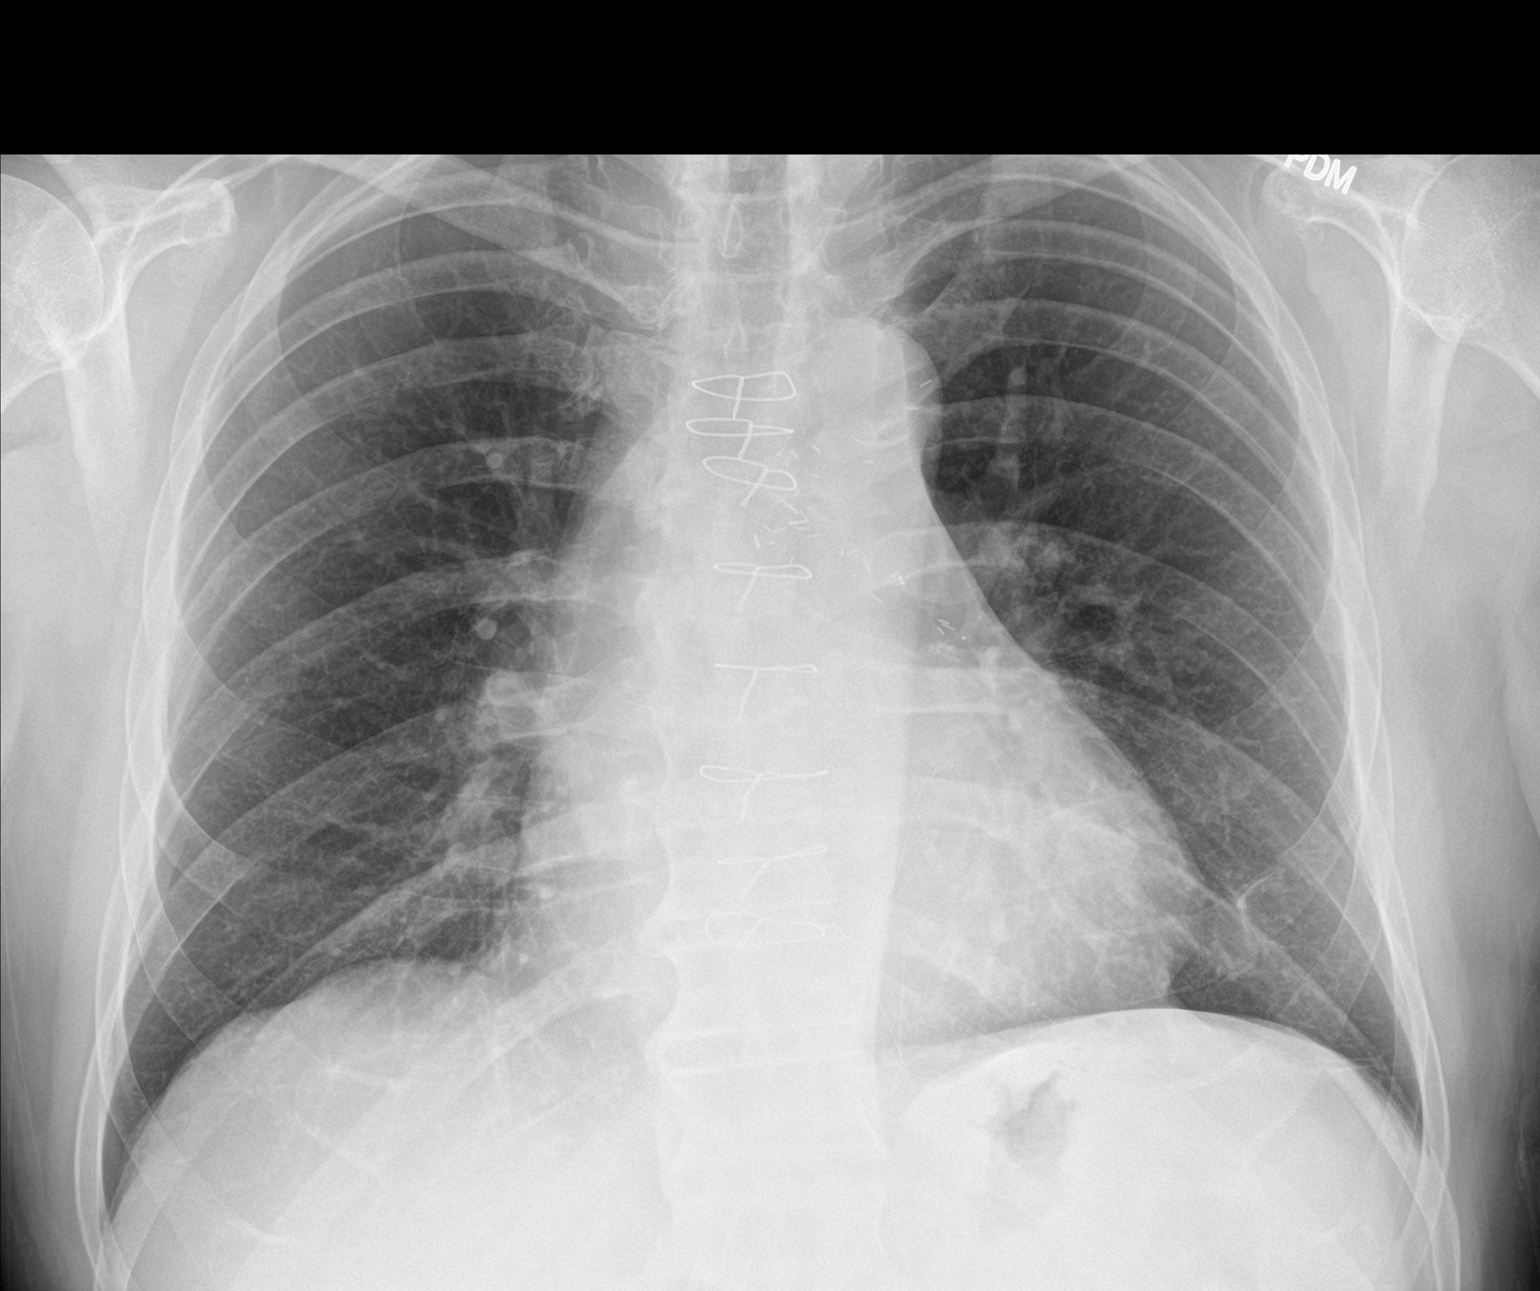

[chest lat]
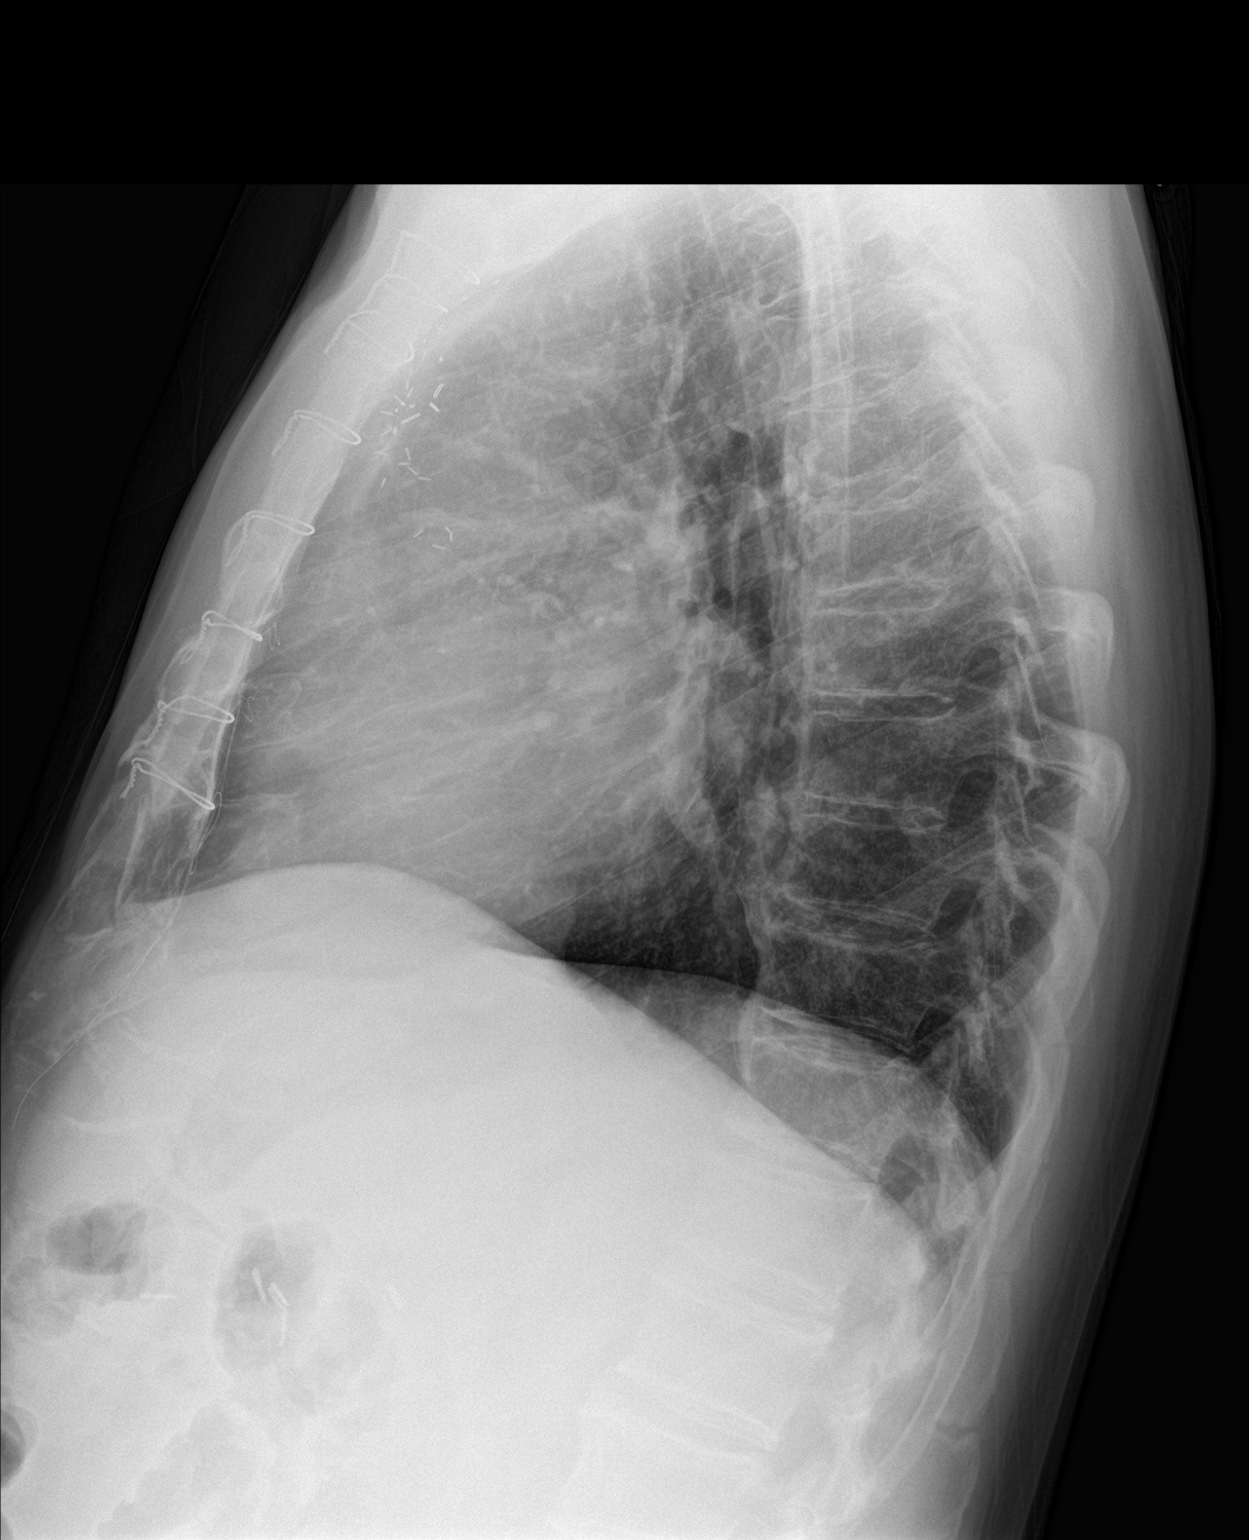

[2 of 2 positions shown; findings below may reference images not displayed]

FINDINGS: Prior CABG. Cardiomegaly with normal pulmonary vascularity. Low lung
volumes with mild bibasilar atelectasis. No pleural effusion or
pneumothorax. Degenerative changes thoracic spine.
IMPRESSION: 1.  Prior CABG.  Stable cardiomegaly.

2.  Low lung volumes with mild bibasilar atelectasis.

## 2018-06-23 IMAGING — CT CT PARANASAL SINUSES LIMITED
1 series · 11 of 13 positions shown, 14 images · non-contrast
Comparison: None

CLINICAL DATA: Upper airway cough syndrome, history asthma,
diabetes mellitus, hypertension

EXAM:
CT PARANASAL SINUS LIMITED WITHOUT CONTRAST
TECHNIQUE: Non-contiguous multidetector CT images of the paranasal sinuses were
obtained in a single axial plane without contrast. RIGHT-side of
face marked with a vitamin-E capsule

[Series 3: limited sinus st · axial · 0.31mm/px · z∈[-184,-84]mm · 11 of 13 slices shown, 14 images]
[im 2/13  brain]
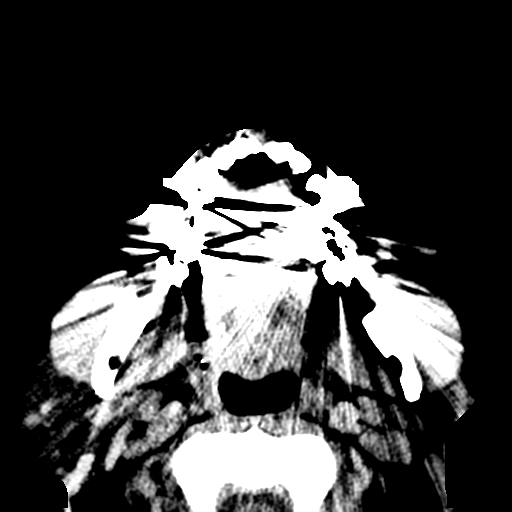
[im 2/13  bone]
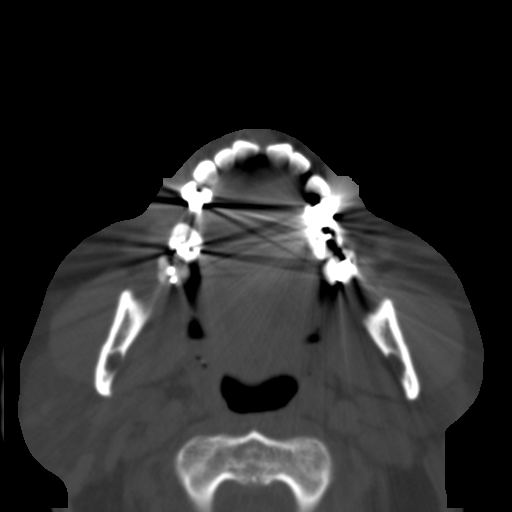
[im 3/13  bone]
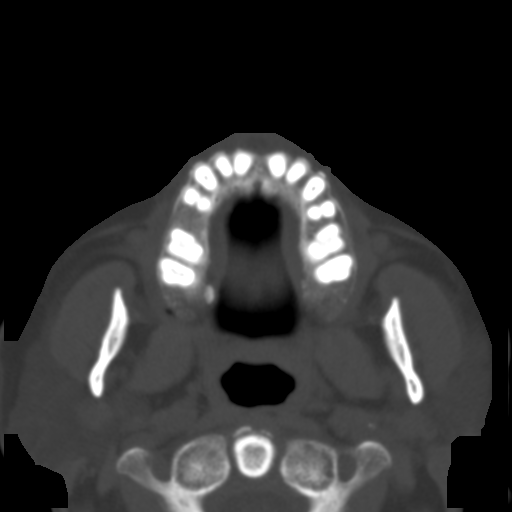
[im 4/13  bone]
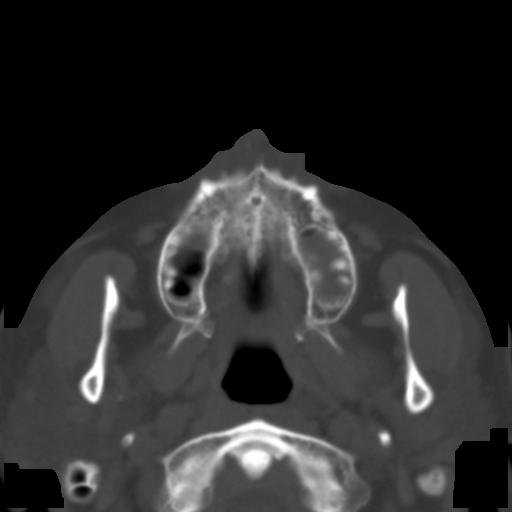
[im 5/13  bone]
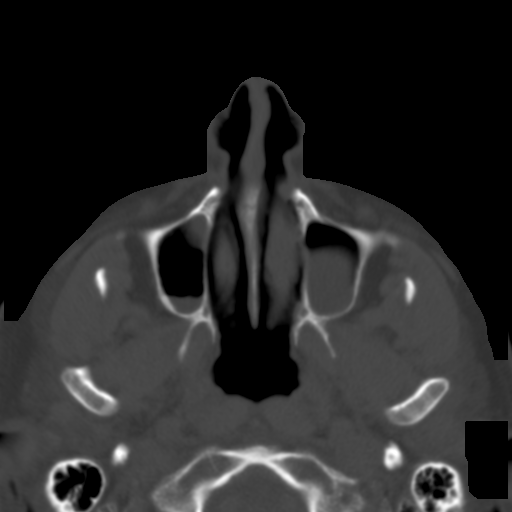
[im 6/13  brain]
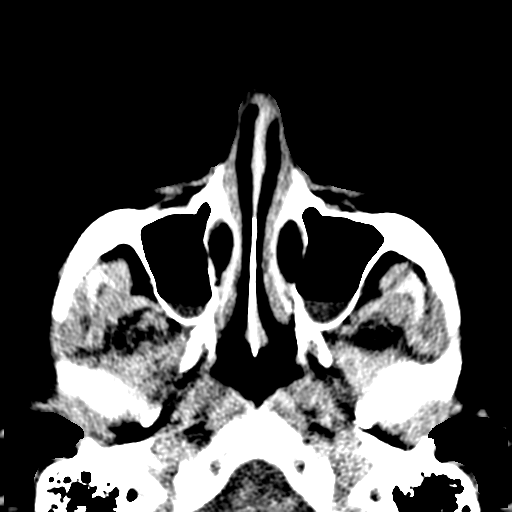
[im 6/13  bone]
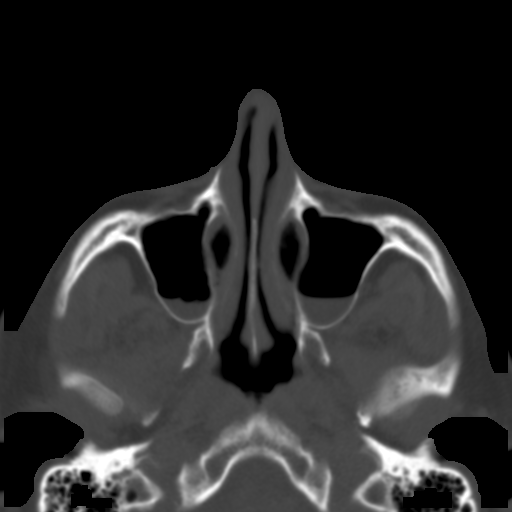
[im 7/13  bone]
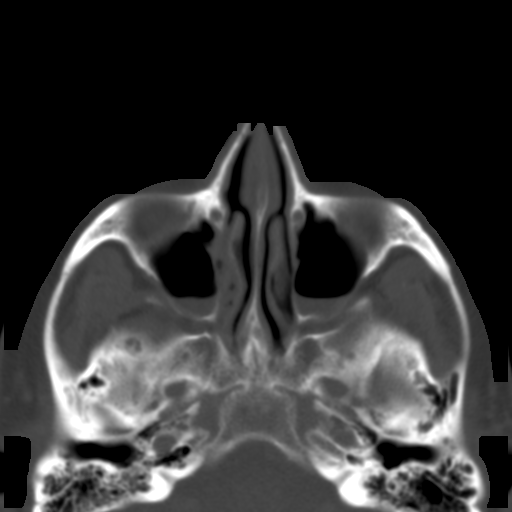
[im 8/13  bone]
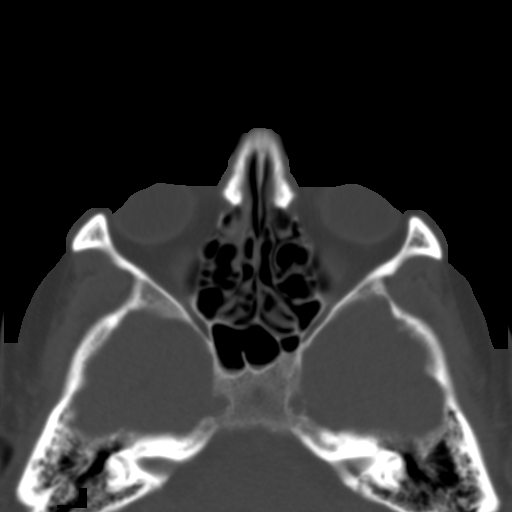
[im 9/13  bone]
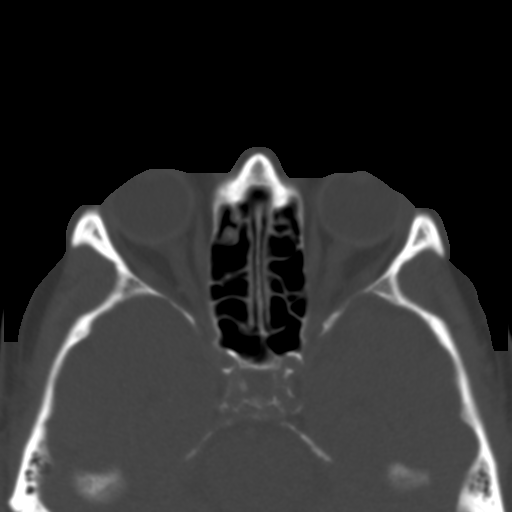
[im 10/13  brain]
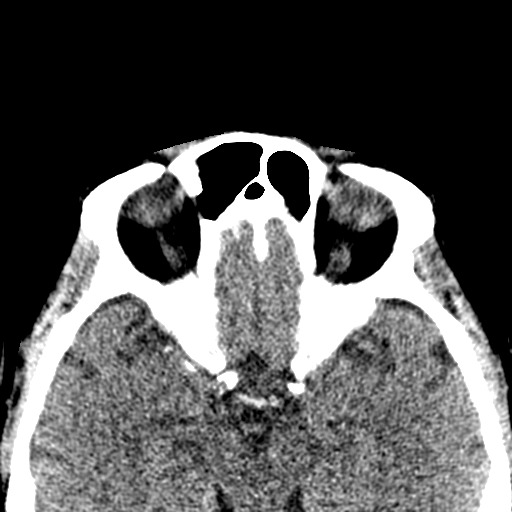
[im 10/13  bone]
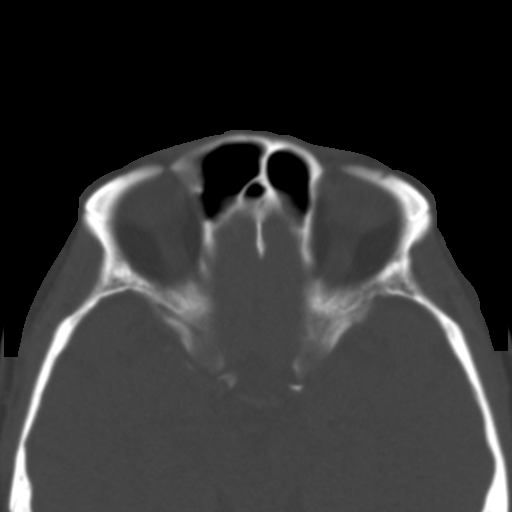
[im 11/13  bone]
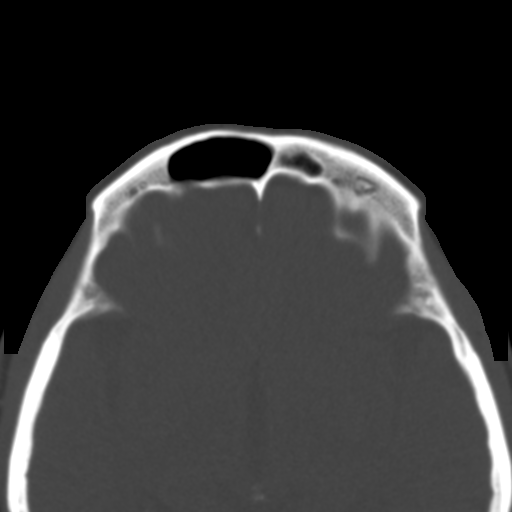
[im 12/13  bone]
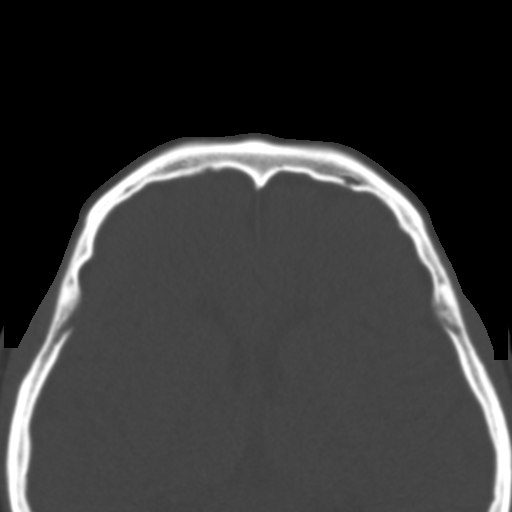

[11 of 13 positions shown; findings below may reference images not displayed]

FINDINGS: Visualized intracranial structures unremarkable.

Small air-fluid levels in the maxillary sinuses bilaterally.

Probable mucosal retention cyst at the floors of the maxillary
sinuses bilaterally larger on LEFT.

Minimal fluid in sphenoid sinus.

Remaining paranasal sinuses and visualized mastoid air cells clear.

Osseous structures unremarkable.

Atherosclerotic calcifications at the carotid siphons bilaterally.
IMPRESSION: Air-fluid levels in the maxillary sinuses and minimally in sphenoid
sinus with suspected small mucosal retention cysts at the maxillary
sinuses.

Internal carotid artery atherosclerotic calcifications bilaterally.

## 2019-05-19 ENCOUNTER — Ambulatory Visit: Payer: Self-pay | Attending: Internal Medicine

## 2019-05-19 DIAGNOSIS — Z23 Encounter for immunization: Secondary | ICD-10-CM | POA: Insufficient documentation

## 2019-05-19 NOTE — Progress Notes (Signed)
   Covid-19 Vaccination Clinic  Name:  Jeremy Bates    MRN: [id number] DOB: 1941/02/25  05/19/2019  Mr. Jeremy Bates was observed post Covid-19 immunization for 15 minutes without incidence. He was provided with Vaccine Information Sheet and instruction to access the V-Safe system.   Mr. Jeremy Bates was instructed to call 911 with any severe reactions post vaccine: Marland Kitchen Difficulty breathing  . Swelling of your face and throat  . A fast heartbeat  . A bad rash all over your body  . Dizziness and weakness    Immunizations Administered    Name Date Dose VIS Date Route   Pfizer COVID-19 Vaccine 05/19/2019 11:05 AM 0.3 mL 04/07/2019 Intramuscular   Manufacturer: [place]   Lot: [id number]   [place]: [id number]

## 2019-06-09 ENCOUNTER — Ambulatory Visit: Payer: Medicare PPO | Attending: Internal Medicine

## 2019-06-09 DIAGNOSIS — Z23 Encounter for immunization: Secondary | ICD-10-CM | POA: Insufficient documentation

## 2019-06-09 NOTE — Progress Notes (Signed)
   Covid-19 Vaccination Clinic  Name:  Jeremy Bates    MRN: [id number] DOB: Apr 10, 1941  06/09/2019  Mr. Jeremy Bates was observed post Covid-19 immunization for 15 minutes without incidence. He was provided with Vaccine Information Sheet and instruction to access the V-Safe system.   Mr. Jeremy Bates was instructed to call 911 with any severe reactions post vaccine: Marland Kitchen Difficulty breathing  . Swelling of your face and throat  . A fast heartbeat  . A bad rash all over your body  . Dizziness and weakness    Immunizations Administered    Name Date Dose VIS Date Route   Pfizer COVID-19 Vaccine 06/09/2019 11:03 AM 0.3 mL 04/07/2019 Intramuscular   Manufacturer: [place]   Lot: [id number]   [place]: [id number]

## 2023-11-12 ENCOUNTER — Encounter: Payer: Self-pay | Admitting: Advanced Practice Midwife
